# Patient Record
Sex: Female | Born: 1990 | Race: White | Hispanic: No | Smoking: Never smoker
Health system: Southern US, Community
[De-identification: ages and names within clinical notes are randomized; demographics above are authoritative.]

## PROBLEM LIST (undated history)

## (undated) DIAGNOSIS — J45909 Unspecified asthma, uncomplicated: Secondary | ICD-10-CM

---

## 2014-07-01 ENCOUNTER — Emergency Department (HOSPITAL_COMMUNITY): Payer: PRIVATE HEALTH INSURANCE

## 2014-07-01 ENCOUNTER — Encounter (HOSPITAL_COMMUNITY): Payer: Self-pay | Admitting: Emergency Medicine

## 2014-07-01 ENCOUNTER — Emergency Department (HOSPITAL_COMMUNITY)
Admission: EM | Admit: 2014-07-01 | Discharge: 2014-07-01 | Disposition: A | Discharge status: Home / Self Care | Payer: PRIVATE HEALTH INSURANCE | Attending: Emergency Medicine | Admitting: Emergency Medicine

## 2014-07-01 DIAGNOSIS — R220 Localized swelling, mass and lump, head: Secondary | ICD-10-CM | POA: Insufficient documentation

## 2014-07-01 DIAGNOSIS — T7840XA Allergy, unspecified, initial encounter: Secondary | ICD-10-CM | POA: Diagnosis not present

## 2014-07-01 DIAGNOSIS — R197 Diarrhea, unspecified: Secondary | ICD-10-CM | POA: Insufficient documentation

## 2014-07-01 DIAGNOSIS — R112 Nausea with vomiting, unspecified: Secondary | ICD-10-CM | POA: Insufficient documentation

## 2014-07-01 DIAGNOSIS — R232 Flushing: Secondary | ICD-10-CM | POA: Insufficient documentation

## 2014-07-01 DIAGNOSIS — Z7951 Long term (current) use of inhaled steroids: Secondary | ICD-10-CM | POA: Insufficient documentation

## 2014-07-01 DIAGNOSIS — J45901 Unspecified asthma with (acute) exacerbation: Secondary | ICD-10-CM | POA: Insufficient documentation

## 2014-07-01 DIAGNOSIS — Z793 Long term (current) use of hormonal contraceptives: Secondary | ICD-10-CM | POA: Diagnosis not present

## 2014-07-01 HISTORY — DX: Unspecified asthma, uncomplicated: J45.909

## 2014-07-01 MED ORDER — PREDNISONE 50 MG PO TABS: 50.0000 mg | ORAL_TABLET | Freq: Every day | ORAL | Status: AC

## 2014-07-01 NOTE — ED Notes (Signed)
Bed: HK06 Expected date:  Expected time:  Means of arrival:  Comments: EMS-allergic reaction

## 2014-07-01 NOTE — ED Notes (Signed)
Per pt she does not want any diagnostic tests ran on her, educated pt on why the tests had been ordered however pt still refusing.  Informed Dr. Colin Rhein.

## 2014-07-01 NOTE — Discharge Instructions (Signed)

## 2014-07-01 NOTE — ED Provider Notes (Signed)
CSN: [id number]     Arrival date & time 07/01/14  1855 History   First MD Initiated Contact with Patient 07/01/14 1903     Chief Complaint  Patient presents with  . Allergic Reaction     (Consider location/radiation/quality/duration/timing/severity/associated sxs/prior Treatment) Patient is a 23 y.o. female presenting with general illness.  Illness Location:  Generalized, face Quality:  Nausea, vomiting, diarrhea, swelling, flushing Severity:  Moderate Onset quality:  Sudden Duration:  1 hour Timing:  Constant Progression:  Resolved Chronicity:  New Context:  No known exposure to new food or insect, last PO 5 hours prior to onset of symptoms Relieved by:  Nothing Worsened by:  Nothing Associated symptoms: diarrhea, nausea, shortness of breath and vomiting   Associated symptoms: no fever     Past Medical History  Diagnosis Date  . Asthma    History reviewed. No pertinent past surgical history. Family History  Problem Relation Age of Onset  . Heart failure Father    History  Substance Use Topics  . Smoking status: Never Smoker   . Smokeless tobacco: Never Used  . Alcohol Use: No   OB History   Grav Para Term Preterm Abortions TAB SAB Ect Mult Living                 Review of Systems  Constitutional: Negative for fever.  Respiratory: Positive for shortness of breath.   Gastrointestinal: Positive for nausea, vomiting and diarrhea.  All other systems reviewed and are negative.     Allergies  Food; Peanuts; and Morphine and related  Home Medications   Prior to Admission medications   Medication Sig Start Date End Date Taking? Authorizing Provider  budesonide-formoterol (SYMBICORT) 160-4.5 MCG/ACT inhaler Inhale 2 puffs into the lungs See admin instructions. 2 puffs every morning. 1 additional dose as needed.   Yes Historical Provider, MD  levonorgestrel-ethinyl estradiol (SEASONALE,INTROVALE,JOLESSA) 0.15-0.03 MG tablet Take 1 tablet by mouth daily.   Yes  Historical Provider, MD  predniSONE (DELTASONE) 50 MG tablet Take 1 tablet (50 mg total) by mouth daily. 07/01/14   Debby Freiberg, MD   BP 114/64  Pulse 85  Temp(Src) 98.8 F (37.1 C) (Oral)  Resp 18  SpO2 100%  LMP 05/01/2014 Physical Exam  Vitals reviewed. Constitutional: She is oriented to person, place, and time. She appears well-developed and well-nourished.  HENT:  Head: Normocephalic and atraumatic.  Right Ear: External ear normal.  Left Ear: External ear normal.  Eyes: Conjunctivae and EOM are normal. Pupils are equal, round, and reactive to light.  Neck: Normal range of motion. Neck supple.  Cardiovascular: Normal rate, regular rhythm, normal heart sounds and intact distal pulses.   Pulmonary/Chest: Effort normal and breath sounds normal.  Abdominal: Soft. Bowel sounds are normal. There is no tenderness.  Musculoskeletal: Normal range of motion.  Neurological: She is alert and oriented to person, place, and time.  Skin: Skin is warm and dry.  Mild flushing of face    ED Course  Procedures (including critical care time) Labs Review Labs Reviewed - No data to display  Imaging Review No results found.   EKG Interpretation None      MDM   Final diagnoses:  Allergic reaction, initial encounter    23 y.o. 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  No known exposure today, however pt was  in a public gym used by her apartment complex.  Initially she felt malaise, followed by nausea and vomiting, then followed by respiratory symptoms and swelling of bilateral eyes.  EMS administered albuterol, Benadryl, Zofran with improvement in symptoms.  On my examination the patient she is no longer tachycardic with heart rate in the 90s, lungs are clear bilaterally, no skin changes found.  As the patient did not have  urticarial skin changes or any known exposure plans to obtain labs and chest x-ray. The patient refused these labs. Discussed with her the possibility of occult pathology and lack of exposure, however given that her symptoms seem consistent with most likely an anaphylactic reaction which is now resolved feel her discharge reasonable.  I informed her that she could have pathology including but not limited to SVC syndrome or catecholamine surge.  The patient was given strict return precautions for symptoms, including possibility of biphasic reaction, she voiced understanding and agreed to followup.   1. Allergic reaction, initial encounter         Debby Freiberg, MD 07/01/14 2029

## 2014-07-01 NOTE — ED Notes (Signed)
Per EMS- Patient ate a hard boiled egg and pasta prior to going to a gym. During middle of workout and became sick on her stomach and lot feeling well. Patient vomited, became SOB. Fire gave 5 mg Albuterol. EMS gave Zofran 4 mg IV, Benadryl 50 mg po. EKG-NSR. Patient had facial edema more so around eyes, and hands. Face was flushed.

## 2015-12-03 ENCOUNTER — Emergency Department (HOSPITAL_COMMUNITY)
Admission: EM | Admit: 2015-12-03 | Discharge: 2015-12-03 | Disposition: A | Discharge status: Home / Self Care | Payer: Self-pay | Attending: Emergency Medicine | Admitting: Emergency Medicine

## 2015-12-03 ENCOUNTER — Emergency Department (HOSPITAL_COMMUNITY): Payer: Self-pay

## 2015-12-03 ENCOUNTER — Encounter (HOSPITAL_COMMUNITY): Payer: Self-pay | Admitting: *Deleted

## 2015-12-03 DIAGNOSIS — R102 Pelvic and perineal pain: Secondary | ICD-10-CM

## 2015-12-03 DIAGNOSIS — D270 Benign neoplasm of right ovary: Secondary | ICD-10-CM | POA: Insufficient documentation

## 2015-12-03 DIAGNOSIS — Z3202 Encounter for pregnancy test, result negative: Secondary | ICD-10-CM | POA: Insufficient documentation

## 2015-12-03 DIAGNOSIS — J45909 Unspecified asthma, uncomplicated: Secondary | ICD-10-CM | POA: Insufficient documentation

## 2015-12-03 DIAGNOSIS — Z79899 Other long term (current) drug therapy: Secondary | ICD-10-CM | POA: Insufficient documentation

## 2015-12-03 LAB — URINALYSIS, ROUTINE W REFLEX MICROSCOPIC
BILIRUBIN URINE: NEGATIVE
Glucose, UA: NEGATIVE mg/dL
Ketones, ur: 40 mg/dL — AB
LEUKOCYTES UA: NEGATIVE
NITRITE: NEGATIVE
PROTEIN: NEGATIVE mg/dL
SPECIFIC GRAVITY, URINE: 1.019 (ref 1.005–1.030)
pH: 5.5 (ref 5.0–8.0)

## 2015-12-03 LAB — URINE MICROSCOPIC-ADD ON
Bacteria, UA: NONE SEEN
WBC, UA: NONE SEEN WBC/hpf (ref 0–5)

## 2015-12-03 LAB — PREGNANCY, URINE: PREG TEST UR: NEGATIVE

## 2015-12-03 MED ORDER — ACETAMINOPHEN 500 MG PO TABS
1000.0000 mg | ORAL_TABLET | Freq: Once | ORAL | Status: AC
Start: 2015-12-03 — End: 2015-12-03
  Administered 2015-12-03: 1000 mg via ORAL
  Filled 2015-12-03: qty 2

## 2015-12-03 MED ORDER — TRAMADOL HCL 50 MG PO TABS: 100.0000 mg | ORAL_TABLET | Freq: Four times a day (QID) | ORAL | Status: AC | PRN

## 2015-12-03 MED ORDER — TRAMADOL HCL 50 MG PO TABS
50.0000 mg | ORAL_TABLET | Freq: Once | ORAL | Status: AC
  Administered 2015-12-03: 50 mg via ORAL
  Filled 2015-12-03: qty 1

## 2015-12-03 MED ORDER — ACETAMINOPHEN 500 MG PO TABS: 1000.0000 mg | ORAL_TABLET | Freq: Four times a day (QID) | ORAL | Status: AC | PRN

## 2015-12-03 NOTE — ED Notes (Signed)
Ultrasound on going. 

## 2015-12-03 NOTE — ED Provider Notes (Signed)
CSN: [id number]     Arrival date & time 12/03/15  0043 History  By signing my name below, I, Doran Stabler, attest that this documentation has been prepared under the direction and in the presence of Charlesetta Shanks, MD. Electronically Signed: Doran Stabler, ED Scribe. 12/03/2015. 1:25 AM.   Chief Complaint  Patient presents with  . Abdominal Pain   The history is provided by the patient. No language interpreter was used.   HPI Comments: Natasha Zhang is a 25 y.o. female with a PMHx of left ovarian dermoid cyst who presents to the Emergency Department complaining of sudden LLQ abdominal pain with associated nausea that began 2-3 hours ago. Pt states that she has had a left ovarian dermoid cyst for the past 2-3 years that recently grew in size. She reports that it is due for removal when she returns to [place]. Pt denies any vaginal discharge, vaginal bleeding, fever, chills, SOB, or any other symptoms at this time. Pt is allergic to advil and morphine.   Past Medical History  Diagnosis Date  . Asthma    History reviewed. No pertinent past surgical history. Family History  Problem Relation Age of Onset  . Heart failure Father    Social History  Substance Use Topics  . Smoking status: Never Smoker   . Smokeless tobacco: Never Used  . Alcohol Use: Yes   OB History    No data available     Review of Systems A complete 10 system review of systems was obtained and all systems are negative except as noted in the HPI and PMH.   Allergies  Food; Peanuts; and Morphine and related  Home Medications   Prior to Admission medications   Medication Sig Start Date End Date Taking? Authorizing Provider  albuterol (PROVENTIL HFA;VENTOLIN HFA) 108 (90 Base) MCG/ACT inhaler Inhale 1 puff into the lungs every 6 (six) hours as needed for wheezing or shortness of breath.   Yes Historical Provider, MD  Ciclesonide (ALVESCO IN) Inhale 2 puffs into the lungs daily.   Yes Historical Provider, MD   levonorgestrel-ethinyl estradiol (SEASONALE,INTROVALE,JOLESSA) 0.15-0.03 MG tablet Take 1 tablet by mouth daily.   Yes Historical Provider, MD  acetaminophen (TYLENOL) 500 MG tablet Take 2 tablets (1,000 mg total) by mouth every 6 (six) hours as needed (take with tramadol for pelvic pain). 12/03/15   Charlesetta Shanks, MD  predniSONE (DELTASONE) 50 MG tablet Take 1 tablet (50 mg total) by mouth daily. Patient not taking: Reported on 12/03/2015 07/01/14   Debby Freiberg, MD  traMADol (ULTRAM) 50 MG tablet Take 2 tablets (100 mg total) by mouth every 6 (six) hours as needed for moderate pain (1-2 tablets as needed every 6 hours). 12/03/15   Charlesetta Shanks, MD   BP 111/96 mmHg  Pulse 81  Temp(Src) 98.3 F (36.8 C) (Oral)  Resp 19  Ht 5\' 4"  (1.626 m)  Wt 128 lb (58.06 kg)  BMI 21.96 kg/m2  SpO2 97%   Physical Exam  Constitutional: She is oriented to person, place, and time. She appears well-developed and well-nourished.  Alert; non-toxic appearing; moderate to severe pain otherwise well in appearance  HENT:  Head: Normocephalic and atraumatic.  Mouth/Throat: Oropharynx is clear and moist.  Eyes: Pupils are equal, round, and reactive to light. No scleral icterus.  Neck: Normal range of motion. Neck supple.  Cardiovascular: Normal rate, regular rhythm and intact distal pulses.   Pulmonary/Chest: Effort normal. No respiratory distress.  Abdominal: Soft. She exhibits no distension and no mass.  Severe LLQ TTP  Musculoskeletal: Normal range of motion. She exhibits no edema or tenderness.  No calf tenderness, no peripheral edema, no CVA tenderness  Neurological: She is alert and oriented to person, place, and time. She exhibits normal muscle tone. Coordination normal.  Skin: Skin is warm and dry.  Psychiatric: She has a normal mood and affect.  Nursing note and vitals reviewed.   ED Course  Procedures   DIAGNOSTIC STUDIES: Oxygen Saturation is 100% on room air, normal by my interpretation.     COORDINATION OF CARE: 1:25 AM Will order US pelvis, pregnancy screen and urinalysis. Discussed treatment plan with pt at bedside and pt agreed to plan.   Labs Review Labs Reviewed  URINALYSIS, ROUTINE W REFLEX MICROSCOPIC (NOT AT [facility]) - Abnormal; Notable for the following:    APPearance CLOUDY (*)    Hgb urine dipstick SMALL (*)    Ketones, ur 40 (*)    All other components within normal limits  URINE MICROSCOPIC-ADD ON - Abnormal; Notable for the following:    Squamous Epithelial / LPF 0-5 (*)    All other components within normal limits  PREGNANCY, URINE    Imaging Review US Transvaginal Non-ob  12/03/2015  CLINICAL DATA:  25 year old female with left lower quadrant abdominal pain, nausea. CT EXAM: TRANSABDOMINAL AND TRANSVAGINAL ULTRASOUND OF PELVIS DOPPLER ULTRASOUND OF OVARIES TECHNIQUE: Both transabdominal and transvaginal ultrasound examinations of the pelvis were performed. Transabdominal technique was performed for global imaging of the pelvis including uterus, ovaries, adnexal regions, and pelvic cul-de-sac. It was necessary to proceed with endovaginal exam following the transabdominal exam to visualize the endometrium and the ovaries. Color and duplex Doppler ultrasound was utilized to evaluate blood flow to the ovaries. COMPARISON:  None. FINDINGS: Uterus Measurements: 7.7 x 3.2 x 4.4 cm. No fibroids or other mass visualized. Endometrium Thickness: 5 mm. No focal abnormality visualized. Trace fluid is noted within the endocervical canal. Right ovary Measurements: 3.2 x 1.4 x 1.6 cm. Normal appearance/no adnexal mass. Left ovary Measurements: 4.1 x 2.5 x 2.5 cm. There is an echogenic mass in the left ovary. Focal area of high echogenicity within the left ovary with posterior shadowing may represent areas of calcification. Findings likely represent a dermoid. MRI may provide better evaluation. Pulsed Doppler evaluation of both ovaries demonstrates normal low-resistance arterial and  venous waveforms. Small free fluid noted within the pelvis. IMPRESSION: Echogenic mass in the left ovary most likely representing a dermoid. MRI may provide better evaluation. Bilateral ovarian Doppler flow identified. Electronically Signed   By: Anner Crete M.D.   On: 12/03/2015 03:31   US Pelvis Complete  12/03/2015  CLINICAL DATA:  25 year old female with left lower quadrant abdominal pain, nausea. CT EXAM: TRANSABDOMINAL AND TRANSVAGINAL ULTRASOUND OF PELVIS DOPPLER ULTRASOUND OF OVARIES TECHNIQUE: Both transabdominal and transvaginal ultrasound examinations of the pelvis were performed. Transabdominal technique was performed for global imaging of the pelvis including uterus, ovaries, adnexal regions, and pelvic cul-de-sac. It was necessary to proceed with endovaginal exam following the transabdominal exam to visualize the endometrium and the ovaries. Color and duplex Doppler ultrasound was utilized to evaluate blood flow to the ovaries. COMPARISON:  None. FINDINGS: Uterus Measurements: 7.7 x 3.2 x 4.4 cm. No fibroids or other mass visualized. Endometrium Thickness: 5 mm. No focal abnormality visualized. Trace fluid is noted within the endocervical canal. Right ovary Measurements: 3.2 x 1.4 x 1.6 cm. Normal appearance/no adnexal mass. Left ovary Measurements: 4.1 x 2.5 x 2.5 cm. There is an echogenic  mass in the left ovary. Focal area of high echogenicity within the left ovary with posterior shadowing may represent areas of calcification. Findings likely represent a dermoid. MRI may provide better evaluation. Pulsed Doppler evaluation of both ovaries demonstrates normal low-resistance arterial and venous waveforms. Small free fluid noted within the pelvis. IMPRESSION: Echogenic mass in the left ovary most likely representing a dermoid. MRI may provide better evaluation. Bilateral ovarian Doppler flow identified. Electronically Signed   By: Anner Crete M.D.   On: 12/03/2015 03:31   [place] Art/ven Flow  Abd Pelv Doppler  12/03/2015  CLINICAL DATA:  25 year old female with left lower quadrant abdominal pain, nausea. CT EXAM: TRANSABDOMINAL AND TRANSVAGINAL ULTRASOUND OF PELVIS DOPPLER ULTRASOUND OF OVARIES TECHNIQUE: Both transabdominal and transvaginal ultrasound examinations of the pelvis were performed. Transabdominal technique was performed for global imaging of the pelvis including uterus, ovaries, adnexal regions, and pelvic cul-de-sac. It was necessary to proceed with endovaginal exam following the transabdominal exam to visualize the endometrium and the ovaries. Color and duplex Doppler ultrasound was utilized to evaluate blood flow to the ovaries. COMPARISON:  None. FINDINGS: Uterus Measurements: 7.7 x 3.2 x 4.4 cm. No fibroids or other mass visualized. Endometrium Thickness: 5 mm. No focal abnormality visualized. Trace fluid is noted within the endocervical canal. Right ovary Measurements: 3.2 x 1.4 x 1.6 cm. Normal appearance/no adnexal mass. Left ovary Measurements: 4.1 x 2.5 x 2.5 cm. There is an echogenic mass in the left ovary. Focal area of high echogenicity within the left ovary with posterior shadowing may represent areas of calcification. Findings likely represent a dermoid. MRI may provide better evaluation. Pulsed Doppler evaluation of both ovaries demonstrates normal low-resistance arterial and venous waveforms. Small free fluid noted within the pelvis. IMPRESSION: Echogenic mass in the left ovary most likely representing a dermoid. MRI may provide better evaluation. Bilateral ovarian Doppler flow identified. Electronically Signed   By: Anner Crete M.D.   On: 12/03/2015 03:31   I have personally reviewed and evaluated these images and lab results as part of my medical decision-making.   EKG Interpretation None      MDM   Final diagnoses:  Dermoid cyst of ovary, right   Patient is well in appearance. Ultrasound shows cyst without evidence of torsion. Small amount of free  fluid. Urinalysis negative for infection and pregnancy negative. At this time I feel patient is appropriate for outpatient pain medication and follow-up. She'll be given tramadol to use with acetaminophen for pain control citing allergy to Motrin.   Charlesetta Shanks, MD 12/03/15 [phone]

## 2015-12-03 NOTE — Discharge Instructions (Signed)
Ovarian Cyst/dermoid cyst An ovarian cyst is a fluid-filled sac that forms on an ovary. The ovaries are small organs that produce eggs in women. Various types of cysts can form on the ovaries. Most are not cancerous. Many do not cause problems, and they often go away on their own. Some may cause symptoms and require treatment. Common types of ovarian cysts include:  Functional cysts--These cysts may occur every month during the menstrual cycle. This is normal. The cysts usually go away with the next menstrual cycle if the woman does not get pregnant. Usually, there are no symptoms with a functional cyst.  Endometrioma cysts--These cysts form from the tissue that lines the uterus. They are also called "chocolate cysts" because they become filled with blood that turns brown. This type of cyst can cause pain in the lower abdomen during intercourse and with your menstrual period.  Cystadenoma cysts--This type develops from the cells on the outside of the ovary. These cysts can get very big and cause lower abdomen pain and pain with intercourse. This type of cyst can twist on itself, cut off its blood supply, and cause severe pain. It can also easily rupture and cause a lot of pain.  Dermoid cysts--This type of cyst is sometimes found in both ovaries. These cysts may contain different kinds of body tissue, such as skin, teeth, hair, or cartilage. They usually do not cause symptoms unless they get very big.  Theca lutein cysts--These cysts occur when too much of a certain hormone (human chorionic gonadotropin) is produced and overstimulates the ovaries to produce an egg. This is most common after procedures used to assist with the conception of a baby (in vitro fertilization). CAUSES   Fertility drugs can cause a condition in which multiple large cysts are formed on the ovaries. This is called ovarian hyperstimulation syndrome.  A condition called polycystic ovary syndrome can cause hormonal imbalances  that can lead to nonfunctional ovarian cysts. SIGNS AND SYMPTOMS  Many ovarian cysts do not cause symptoms. If symptoms are present, they may include:  Pelvic pain or pressure.  Pain in the lower abdomen.  Pain during sexual intercourse.  Increasing girth (swelling) of the abdomen.  Abnormal menstrual periods.  Increasing pain with menstrual periods.  Stopping having menstrual periods without being pregnant. DIAGNOSIS  These cysts are commonly found during a routine or annual pelvic exam. Tests may be ordered to find out more about the cyst. These tests may include:  Ultrasound.  X-ray of the pelvis.  CT scan.  MRI.  Blood tests. TREATMENT  Many ovarian cysts go away on their own without treatment. Your health care provider may want to check your cyst regularly for 2-3 months to see if it changes. For women in menopause, it is particularly important to monitor a cyst closely because of the higher rate of ovarian cancer in menopausal women. When treatment is needed, it may include any of the following:  A procedure to drain the cyst (aspiration). This may be done using a long needle and ultrasound. It can also be done through a laparoscopic procedure. This involves using a thin, lighted tube with a tiny camera on the end (laparoscope) inserted through a small incision.  Surgery to remove the whole cyst. This may be done using laparoscopic surgery or an open surgery involving a larger incision in the lower abdomen.  Hormone treatment or birth control pills. These methods are sometimes used to help dissolve a cyst. HOME CARE INSTRUCTIONS   Only take  over-the-counter or prescription medicines as directed by your health care provider.  Follow up with your health care provider as directed.  Get regular pelvic exams and Pap tests. SEEK MEDICAL CARE IF:   Your periods are late, irregular, or painful, or they stop.  Your pelvic pain or abdominal pain does not go away.  Your  abdomen becomes larger or swollen.  You have pressure on your bladder or trouble emptying your bladder completely.  You have pain during sexual intercourse.  You have feelings of fullness, pressure, or discomfort in your stomach.  You lose weight for no apparent reason.  You feel generally ill.  You become constipated.  You lose your appetite.  You develop acne.  You have an increase in body and facial hair.  You are gaining weight, without changing your exercise and eating habits.  You think you are pregnant. SEEK IMMEDIATE MEDICAL CARE IF:   You have increasing abdominal pain.  You feel sick to your stomach (nauseous), and you throw up (vomit).  You develop a fever that comes on suddenly.  You have abdominal pain during a bowel movement.  Your menstrual periods become heavier than usual. MAKE SURE YOU:  Understand these instructions.  Will watch your condition.  Will get help right away if you are not doing well or get worse.   This information is not intended to replace advice given to you by your health care provider. Make sure you discuss any questions you have with your health care provider.   Document Released: 09/16/2005 Document Revised: 09/21/2013 Document Reviewed: 05/24/2013 Elsevier Interactive Patient Education [employer].

## 2015-12-03 NOTE — ED Notes (Signed)
Patient transported to Ultrasound 

## 2015-12-03 NOTE — ED Notes (Signed)
Pt reports severe left lower abdominal pain for about 2-3 hours, associated with nausea. Pt states she has a known cyst on the left ovary. No vaginal d/c or bleeding.

## 2017-10-14 ENCOUNTER — Other Ambulatory Visit (HOSPITAL_COMMUNITY)
Admission: RE | Admit: 2017-10-14 | Discharge: 2017-10-14 | Disposition: A | Discharge status: Home / Self Care | Payer: BLUE CROSS/BLUE SHIELD | Source: Ambulatory Visit | Attending: Obstetrics and Gynecology | Admitting: Obstetrics and Gynecology

## 2017-10-14 ENCOUNTER — Other Ambulatory Visit: Payer: Self-pay | Admitting: Obstetrics and Gynecology

## 2017-10-14 DIAGNOSIS — Z124 Encounter for screening for malignant neoplasm of cervix: Secondary | ICD-10-CM | POA: Diagnosis present

## 2017-10-15 LAB — CYTOLOGY - PAP
Chlamydia: NEGATIVE
Diagnosis: NEGATIVE
Neisseria Gonorrhea: NEGATIVE

## 2017-12-05 IMAGING — US US PELVIS COMPLETE
1 series · 13 of 25 positions shown · non-contrast
Comparison: None.

CLINICAL DATA: 24-year-old female with left lower quadrant
abdominal pain, nausea. CT

EXAM:
TRANSABDOMINAL AND TRANSVAGINAL ULTRASOUND OF PELVIS
DOPPLER ULTRASOUND OF OVARIES
TECHNIQUE: Both transabdominal and transvaginal ultrasound examinations of the
pelvis were performed. Transabdominal technique was performed for
global imaging of the pelvis including uterus, ovaries, adnexal
regions, and pelvic cul-de-sac.
It was necessary to proceed with endovaginal exam following the
transabdominal exam to visualize the endometrium and the ovaries.
Color and duplex Doppler ultrasound was utilized to evaluate blood
flow to the ovaries.

[Series 1: us pelvis complete · 0.19mm/px · 13 of 86 slices shown]
[im 1/86]
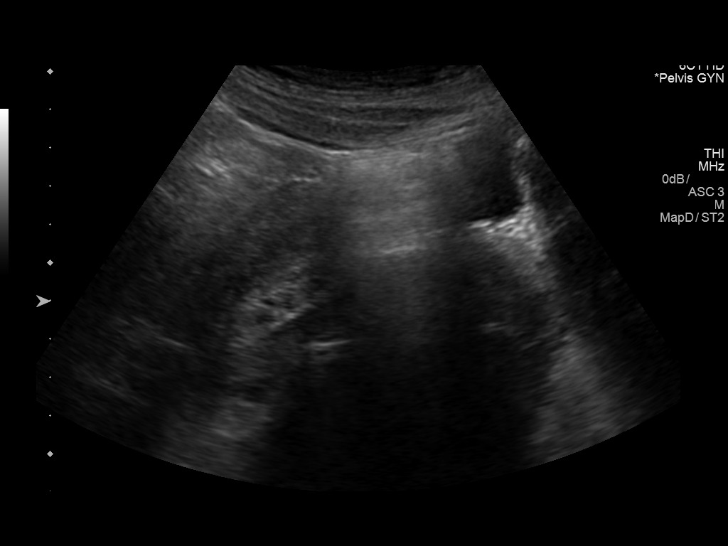
[im 8/86]
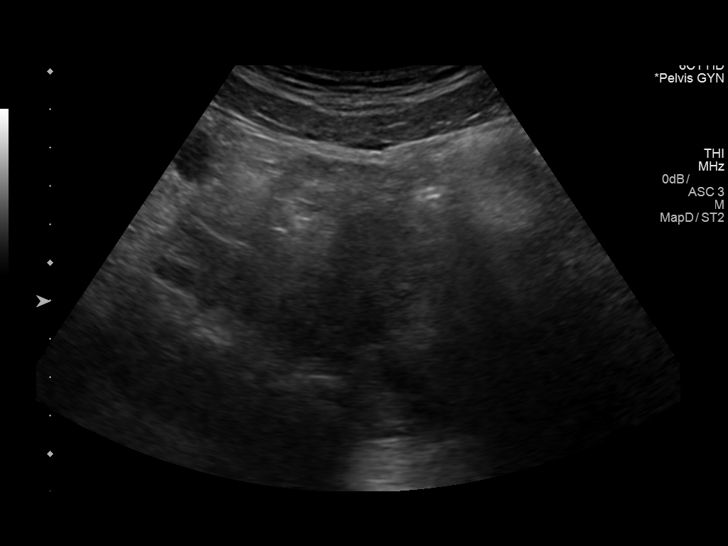
[im 15/86]
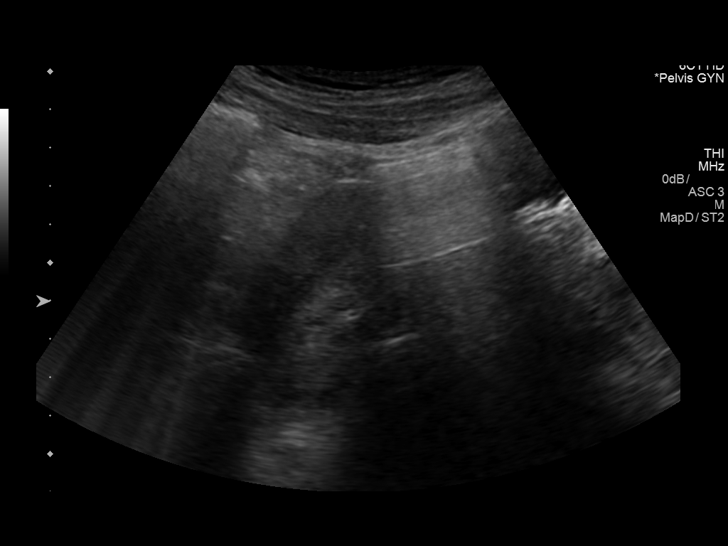
[im 22/86]
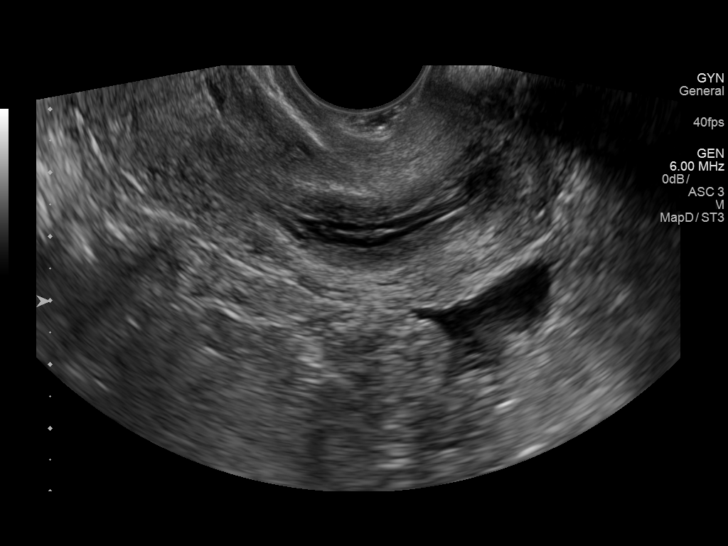
[im 29/86]
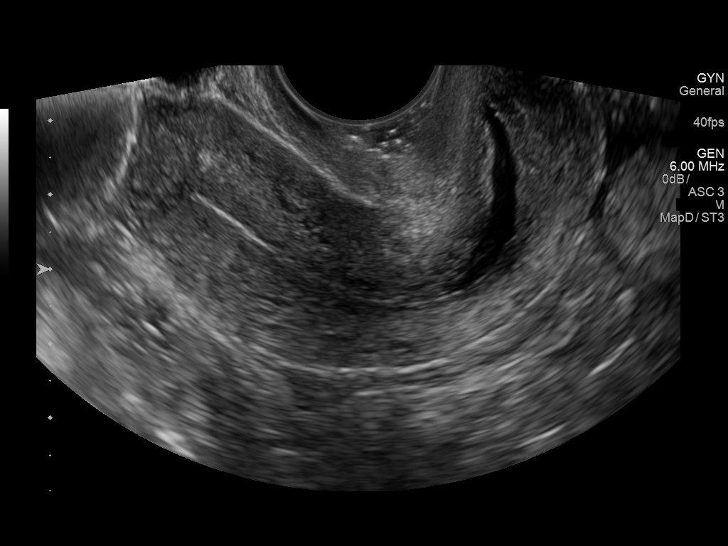
[im 36/86]
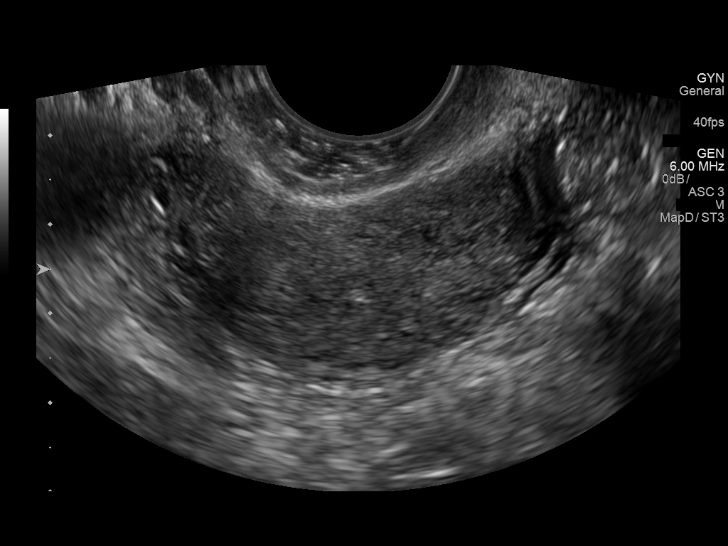
[im 43/86]
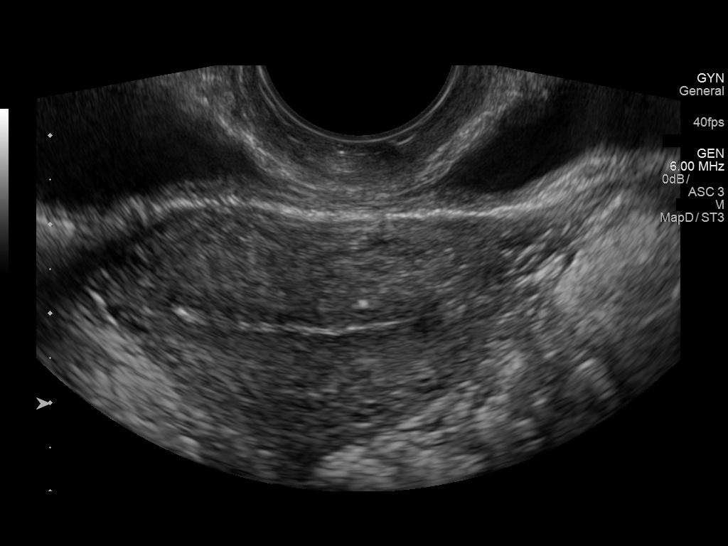
[im 50/86]
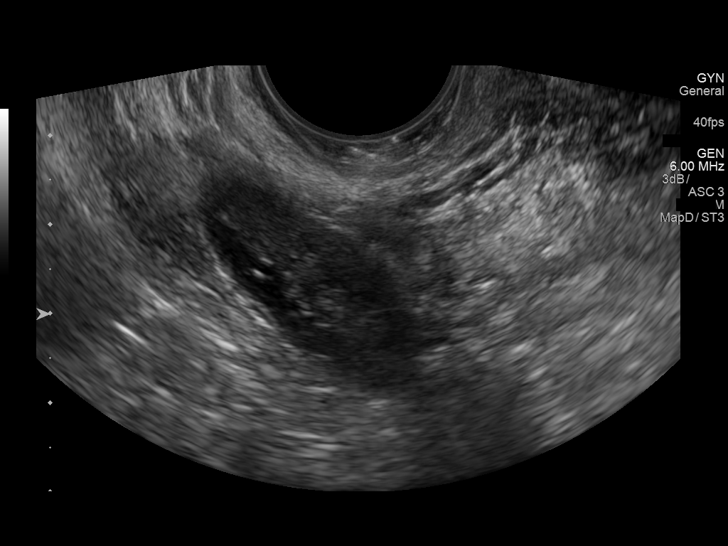
[im 57/86]
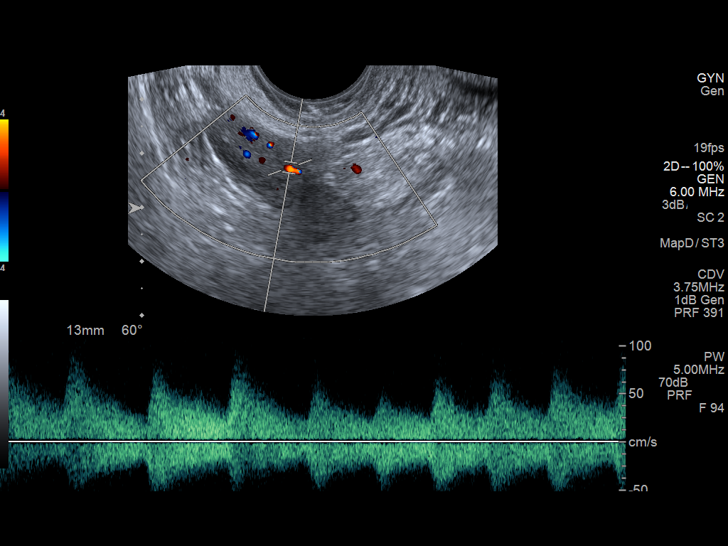
[im 64/86]
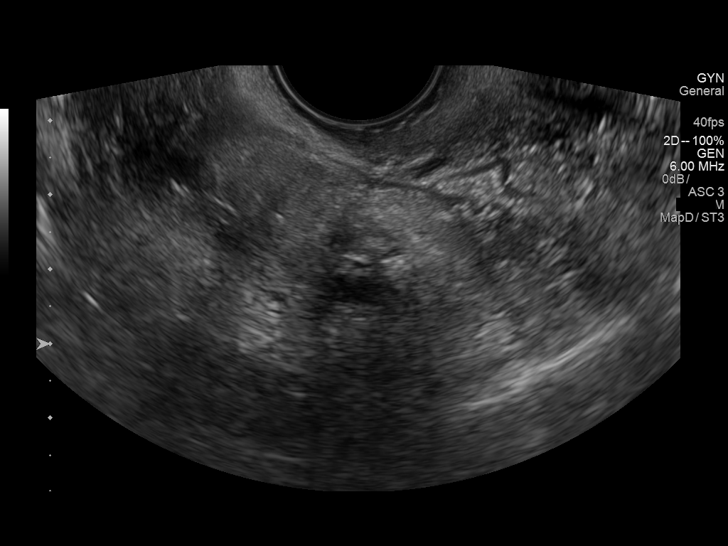
[im 71/86]
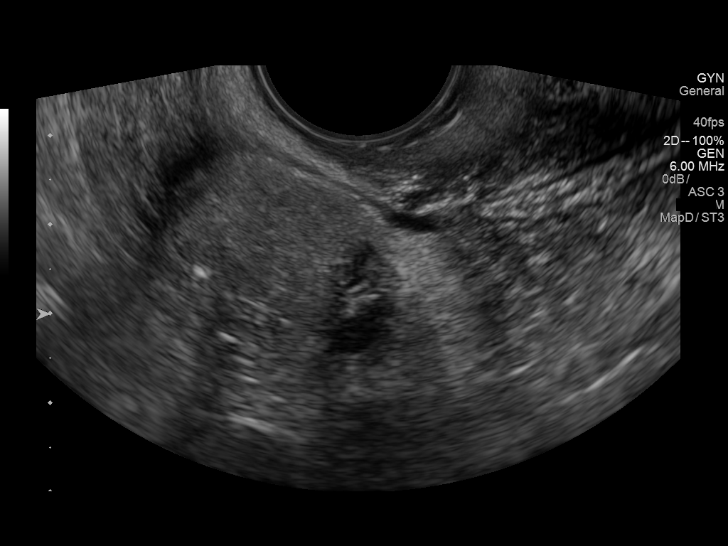
[im 78/86]
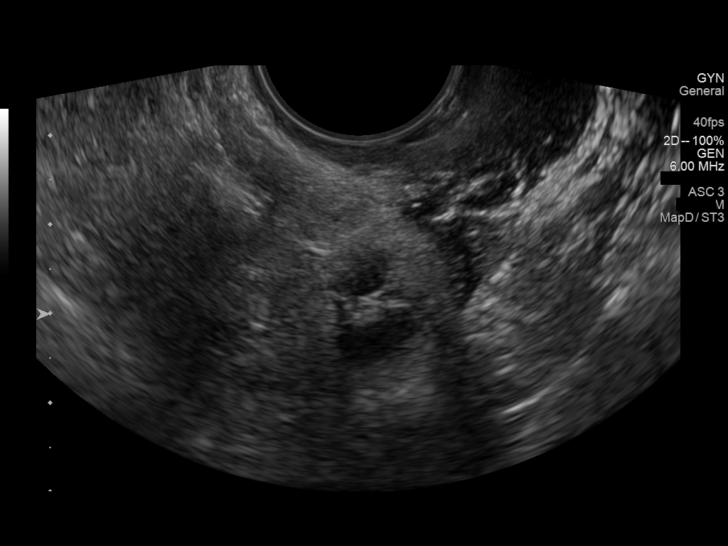
[im 86/86]
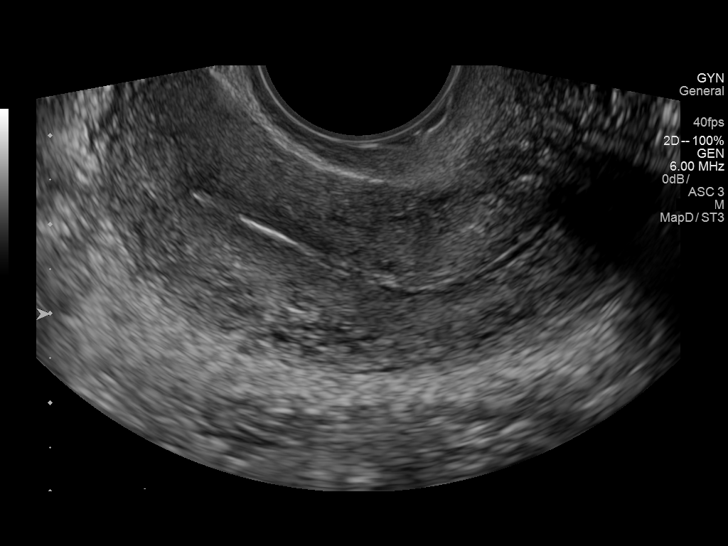

[13 of 25 positions shown; findings below may reference images not displayed]

FINDINGS: Uterus

Measurements: 7.7 x 3.2 x 4.4 cm. No fibroids or other mass
visualized.

Endometrium

Thickness: 5 mm. No focal abnormality visualized. Trace fluid is
noted within the endocervical canal.

Right ovary

Measurements: 3.2 x 1.4 x 1.6 cm. Normal appearance/no adnexal mass.

Left ovary

Measurements: 4.1 x 2.5 x 2.5 cm. There is an echogenic mass in the
left ovary. Focal area of high echogenicity within the left ovary
with posterior shadowing may represent areas of calcification.
Findings likely represent a dermoid. MRI may provide better
evaluation.

Pulsed Doppler evaluation of both ovaries demonstrates normal
low-resistance arterial and venous waveforms.

Small free fluid noted within the pelvis.
IMPRESSION: Echogenic mass in the left ovary most likely representing a dermoid.
MRI may provide better evaluation.

Bilateral ovarian Doppler flow identified.
